# Patient Record
Sex: Male | Born: 1975 | Race: Black or African American | Hispanic: No | Marital: Married | Smoking: Current every day smoker
Health system: Southern US, Community
[De-identification: ages and names within clinical notes are randomized; demographics above are authoritative.]

## PROBLEM LIST (undated history)

## (undated) DIAGNOSIS — K219 Gastro-esophageal reflux disease without esophagitis: Secondary | ICD-10-CM

---

## 2004-08-03 ENCOUNTER — Emergency Department (HOSPITAL_COMMUNITY): Admission: EM | Admit: 2004-08-03 | Discharge: 2004-08-03 | Payer: Self-pay | Admitting: Emergency Medicine

## 2017-10-16 ENCOUNTER — Emergency Department (HOSPITAL_COMMUNITY)
Admission: EM | Admit: 2017-10-16 | Discharge: 2017-10-16 | Disposition: A | Discharge status: Home / Self Care | Payer: BLUE CROSS/BLUE SHIELD | Attending: Emergency Medicine | Admitting: Emergency Medicine

## 2017-10-16 ENCOUNTER — Encounter (HOSPITAL_COMMUNITY): Payer: Self-pay | Admitting: Emergency Medicine

## 2017-10-16 ENCOUNTER — Other Ambulatory Visit: Payer: Self-pay

## 2017-10-16 DIAGNOSIS — F17210 Nicotine dependence, cigarettes, uncomplicated: Secondary | ICD-10-CM | POA: Insufficient documentation

## 2017-10-16 DIAGNOSIS — R202 Paresthesia of skin: Secondary | ICD-10-CM | POA: Insufficient documentation

## 2017-10-16 NOTE — Discharge Instructions (Signed)
Please follow up with your primary care provider as needed. If symptoms persist, or you have new or worsening symptoms, report back to the ER.

## 2017-10-16 NOTE — ED Triage Notes (Signed)
Patient reports tingling to left hand and left foot x1 hour. Ambulatory. Denies pain. Denies blurred vision, headache, and weakness. Reports tingling worsens with movement. A&Ox4. Equal grips bilaterally.

## 2017-10-16 NOTE — ED Provider Notes (Signed)
[facility]-EMERGENCY DEPT Provider Note   CSN: [id number] Arrival date & time: 10/16/17  1356     History   Chief Complaint Chief Complaint  Patient presents with  . Tingling    HPI Julian Page is a 42 y.o. male without significant PMHx, presenting to ED with bilateral hand and foot tingling that began about 3 hours ago. 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 He states he wears a respirator at work for protection from chemicals. States he had a normal day at work. Was looking at a sign posted on the wall for "symptom to watch for" and he states he began feeling those symptoms shortly following. States he had some tingling sensation to /l hands and feet which has been improving and is nearly gone. Also states he felt like he had phlegm in his throat, which is also gone. Denies and respiratory complaints, HA, vision changes, abdominal pain, nausea, or other complaints.  The history is provided by the patient.    History reviewed. No pertinent past medical history.  There are no active problems to display for this patient.   History reviewed. No pertinent surgical history.     Home Medications    Prior to Admission medications   Medication Sig Start Date End Date Taking? Authorizing Provider  Multiple Vitamin (ONE-A-DAY MENS PO) Take 1 tablet by mouth daily.   Yes [provider]    Family History No family history on file.  Social History Social History   Tobacco Use  . Smoking status: Current Every Day Smoker  Substance Use Topics  . Alcohol use: Yes    Alcohol/week: 6.0 oz    Types: 10 Cans of beer per week  . Drug use: No     Allergies   Penicillins   Review of Systems Review of Systems  Eyes: Negative for visual disturbance.  Respiratory: Negative for chest tightness and shortness of breath.   Cardiovascular: Negative for chest pain.  Gastrointestinal: Negative for abdominal  pain.  Musculoskeletal: Negative for neck pain.  Neurological: Negative for facial asymmetry, speech difficulty, weakness, numbness and headaches.  Psychiatric/Behavioral: Negative for confusion.  All other systems reviewed and are negative.    Physical Exam Updated Vital Signs BP (!) 140/97 (BP Location: Right Arm)   Pulse 77   Temp 98.2 F (36.8 C)   Resp 16   Ht 6\' 2"  (1.88 m)   Wt 70.3 kg (155 lb)   SpO2 100%   BMI 19.90 kg/m   Physical Exam  Constitutional: He is oriented to person, place, and time. He appears well-developed and well-nourished. No distress.  HENT:  Head: Normocephalic and atraumatic.  Mouth/Throat: Oropharynx is clear and moist.  Eyes: Conjunctivae and EOM are normal. Pupils are equal, round, and reactive to light.  Neck: Normal range of motion.  Cardiovascular: Normal rate, regular rhythm, normal heart sounds and intact distal pulses.  Pulmonary/Chest: Effort normal and breath sounds normal. No respiratory distress.  Abdominal: Soft. Bowel sounds are normal.  Neurological: He is alert and oriented to person, place, and time.  Mental Status:  Alert, oriented, thought content appropriate, able to give a coherent history. Speech fluent without evidence of aphasia. Able to follow 2 step commands without difficulty.  Cranial Nerves:  II:  Peripheral visual fields grossly normal, pupils equal, round, reactive to light III,IV, VI: ptosis not present, extra-ocular motions intact bilaterally  V,VII: smile symmetric, facial light touch  sensation equal VIII: hearing grossly normal to voice  X: uvula elevates symmetrically  XI: bilateral shoulder shrug symmetric and strong XII: midline tongue extension without fassiculations Motor:  Normal tone. 5/5 in upper and lower extremities bilaterally including strong and equal grip strength and dorsiflexion/plantar flexion Sensory: Pinprick and light touch normal in all extremities.  Deep Tendon Reflexes: 2+ and  symmetric in the biceps and patella Cerebellar: normal finger-to-nose with bilateral upper extremities Gait: normal gait and balance CV: distal pulses palpable throughout    Skin: Skin is warm.  Psychiatric: He has a normal mood and affect. His behavior is normal.  Nursing note and vitals reviewed.    ED Treatments / Results  Labs (all labs ordered are listed, but only abnormal results are displayed) Labs Reviewed - No data to display  EKG  EKG Interpretation None       Radiology No results found.  Procedures Procedures (including critical care time)  Medications Ordered in ED Medications - No data to display   Initial Impression / Assessment and Plan / ED Course  I have reviewed the triage vital signs and the nursing notes.  Pertinent labs & imaging results that were available during my care of the patient were reviewed by me and considered in my medical decision making (see chart for details).     Patient presenting with bilateral foot and hand tingling times 3 hours, that has been improving since that time.  Patient with possible exposure to toluene chemical substance as he works in [employer].  Patient without respiratory complaints, or any other symptoms.  Normal neurologic exam, lungs clear, patient well-appearing not in distress.  No comorbidities.  Patient discussed with Dr. Freida Busman, who recommends patient is safe for discharge with strict return precautions.  Discussed strict return precautions with patient, including worsening symptoms, or new neurologic or respiratory symptoms.  Patient agrees with care plan and is safe for discharge.  Discussed results, findings, treatment and follow up. Patient advised of return precautions. Patient verbalized understanding and agreed with plan.   Final Clinical Impressions(s) / ED Diagnoses   Final diagnoses:  Tingling    ED Discharge Orders    None       Annina Piotrowski, [place] N, PA-C 10/16/17 1734      Lorre Nick, MD 10/16/17 1746

## 2017-12-15 ENCOUNTER — Other Ambulatory Visit: Payer: Self-pay

## 2017-12-15 ENCOUNTER — Emergency Department (INDEPENDENT_AMBULATORY_CARE_PROVIDER_SITE_OTHER)
Admission: EM | Admit: 2017-12-15 | Discharge: 2017-12-15 | Disposition: A | Discharge status: Home / Self Care | Payer: BLUE CROSS/BLUE SHIELD | Source: Home / Self Care | Attending: Family Medicine | Admitting: Family Medicine

## 2017-12-15 ENCOUNTER — Emergency Department (INDEPENDENT_AMBULATORY_CARE_PROVIDER_SITE_OTHER): Payer: BLUE CROSS/BLUE SHIELD

## 2017-12-15 ENCOUNTER — Encounter: Payer: Self-pay | Admitting: Emergency Medicine

## 2017-12-15 DIAGNOSIS — R0602 Shortness of breath: Secondary | ICD-10-CM

## 2017-12-15 MED ORDER — PREDNISONE 20 MG PO TABS: ORAL_TABLET | ORAL | 0 refills | Status: DC

## 2017-12-15 NOTE — ED Provider Notes (Signed)
[facility]    CSN: [id number] Arrival date & time: 12/15/17  1308     History   Chief Complaint Chief Complaint  Patient presents with  . Shortness of Breath  . Dysphagia    HPI Julian Page is a 42 y.o. male.   Patient reports that about 2.5 weeks ago he inhaled a small amount of chemical vapor at work, and subsequently underwent evaluation with physical exam.  Five days ago he had difficulty swallowing and shortness of breath.  He was prescribed an albuterol inhaler and fluticasone nasal spray which have not been helpful.  He feels well otherwise.  No fevers, chills, and sweats. His son has asthma.  The history is provided by the patient.    History reviewed. No pertinent past medical history.  There are no active problems to display for this patient.   History reviewed. No pertinent surgical history.     Home Medications    Prior to Admission medications   Medication Sig Start Date End Date Taking? Authorizing Provider  albuterol (PROVENTIL) (2.5 MG/3ML) 0.083% nebulizer solution Take 2.5 mg by nebulization every 4 (four) hours as needed for wheezing or shortness of breath.   Yes [provider]  fluticasone (VERAMYST) 27.5 MCG/SPRAY nasal spray Place 2 sprays into the nose daily.   Yes [provider]  Multiple Vitamin (ONE-A-DAY MENS PO) Take 1 tablet by mouth daily.    [provider]  predniSONE (DELTASONE) 20 MG tablet Take one tab by mouth twice daily for 4 days, then one daily. Take with food. 12/15/17   Lattie Haw, MD    Family History No family history on file.  Social History Social History   Tobacco Use  . Smoking status: Current Every Day Smoker  . Smokeless tobacco: Never Used  Substance Use Topics  . Alcohol use: Yes    Alcohol/week: 6.0 oz    Types: 10 Cans of beer per week  . Drug use: No     Allergies   Penicillins   Review of Systems Review of Systems No sore throat No cough No  pleuritic pain, but feels tight in his anterior chesrt No wheezing + nasal congestion No post-nasal drainage No sinus pain/pressure No itchy/red eyes No earache No hemoptysis + SOB No fever/chills No nausea No vomiting No abdominal pain No diarrhea No urinary symptoms No skin rash + fatigue No myalgias No headache Used OTC meds without relief   Physical Exam Triage Vital Signs ED Triage Vitals  Enc Vitals Group     BP 12/15/17 1349 111/75     Pulse Rate 12/15/17 1349 82     Resp 12/15/17 1349 18     Temp 12/15/17 1349 98.9 F (37.2 C)     Temp Source 12/15/17 1349 Oral     SpO2 12/15/17 1349 100 %     Weight 12/15/17 1350 155 lb (70.3 kg)     Height 12/15/17 1350 6\' 2"  (1.88 m)     Head Circumference --      Peak Flow --      Pain Score 12/15/17 1350 0     Pain Loc --      Pain Edu? --      Excl. in GC? --    No data found.  Updated Vital Signs BP 111/75 (BP Location: Right Arm)   Pulse 82   Temp 98.9 F (37.2 C) (Oral)   Resp 18   Ht 6\' 2"  (1.88 m)  Wt 155 lb (70.3 kg)   SpO2 100%   BMI 19.90 kg/m   Visual Acuity Right Eye Distance:   Left Eye Distance:   Bilateral Distance:    Right Eye Near:   Left Eye Near:    Bilateral Near:     Physical Exam Nursing notes and Vital Signs reviewed. Appearance:  Patient appears stated age, and in no acute distress Eyes:  Pupils are equal, round, and reactive to light and accomodation.  Extraocular movement is intact.  Conjunctivae are not inflamed  Ears:  Canals normal.  Tympanic membranes normal.  Nose:  Mildly congested turbinates.  No sinus tenderness.   Pharynx:  Normal Neck:  Supple.  No adenopathy.  Mild tenderness to palpation over thyroid.  Lungs:  Clear to auscultation.  Breath sounds are equal.  Moving air well. Heart:  Regular rate and rhythm without murmurs, rubs, or gallops.  Abdomen:  Nontender without masses or hepatosplenomegaly.  Bowel sounds are present.  No CVA or flank tenderness.    Extremities:  No edema.  Skin:  No rash present.    UC Treatments / Results  Labs (all labs ordered are listed, but only abnormal results are displayed) Labs Reviewed - No data to display  EKG None Radiology Dg Chest 2 View  Result Date: 12/15/2017 CLINICAL DATA:  Shortness of breath for 2 weeks, mild dyspnea for 2.5 weeks EXAM: CHEST - 2 VIEW COMPARISON:  None FINDINGS: Normal heart size, mediastinal contours, and pulmonary vascularity. Lungs mildly hyperinflated but clear. No pulmonary infiltrate, pleural effusion or pneumothorax. Bones unremarkable. IMPRESSION: Mild hyperinflation without acute infiltrate. Electronically Signed   By: Ulyses SouthwardMark  Boles M.D.   On: 12/15/2017 14:30    Procedures Procedures (including critical care time)  Medications Ordered in UC Medications - No data to display   Initial Impression / Assessment and Plan / UC Course  I have reviewed the triage vital signs and the nursing notes.  Pertinent labs & imaging results that were available during my care of the patient were reviewed by me and considered in my medical decision making (see chart for details).    Suspect mild reactive airways disease. Begin prednisone burst/taper. May continue Mucinex D for cough and congestion.  Continue fluticasone nasal spray for allergy symptoms. Followup with Family Doctor if not improved in one to two weeks.    Final Clinical Impressions(s) / UC Diagnoses   Final diagnoses:  Shortness of breath    ED Discharge Orders        Ordered    predniSONE (DELTASONE) 20 MG tablet     12/15/17 1456         Lattie HawBeese, Damyon Mullane A, MD 12/20/17 2203

## 2017-12-15 NOTE — ED Triage Notes (Signed)
Patient reports about 3 weeks ago inhaled small amount of chemical at work; followed with evaluation and physical; 5 days ago went again for difficulty swallowing and some shortness of breath; given inhaler and nasal spray which have not helped.

## 2017-12-15 NOTE — Discharge Instructions (Addendum)
May continue Mucinex D for cough and congestion.  Continue fluticasone nasal spray for allergy symptoms.

## 2017-12-25 ENCOUNTER — Telehealth: Payer: Self-pay

## 2017-12-25 NOTE — Telephone Encounter (Signed)
Pt called and is having the same sx from his previous visit.  He is asking for a renewal of his prednisone.  Spoke with Langston Masker, PA-C and script called in to [facility].

## 2018-01-14 ENCOUNTER — Emergency Department (INDEPENDENT_AMBULATORY_CARE_PROVIDER_SITE_OTHER)
Admission: EM | Admit: 2018-01-14 | Discharge: 2018-01-14 | Disposition: A | Discharge status: Home / Self Care | Payer: Managed Care, Other (non HMO) | Source: Home / Self Care | Attending: Family Medicine | Admitting: Family Medicine

## 2018-01-14 DIAGNOSIS — R0989 Other specified symptoms and signs involving the circulatory and respiratory systems: Secondary | ICD-10-CM

## 2018-01-14 DIAGNOSIS — K219 Gastro-esophageal reflux disease without esophagitis: Secondary | ICD-10-CM | POA: Diagnosis not present

## 2018-01-14 MED ORDER — PREDNISONE 20 MG PO TABS: ORAL_TABLET | ORAL | 0 refills | Status: AC

## 2018-01-14 MED ORDER — OMEPRAZOLE 20 MG PO CPDR
20.0000 mg | DELAYED_RELEASE_CAPSULE | Freq: Every day | ORAL | 0 refills | Status: AC
Start: 2018-01-14 — End: ?

## 2018-01-14 NOTE — ED Provider Notes (Addendum)
[facility]    CSN: [id number] Arrival date & time: 01/14/18  1313     History   Chief Complaint Chief Complaint  Patient presents with  . Sore Throat    HPI Julian Page is a 42 y.o. male.   HPI Julian Page is a 42 y.o. male presenting to UC with c/o throat irritation that has been intermittent for about 1 month with associated difficulty breathing at times.  Symptoms started at work when he inhaled a chemical. He no longer works in that area. He did not file worker's comp.  He was evaluated at that time, prescribed Flonase and albuterol inhaler but no relief.  He was seen at [facility] for same on 12/15/17 and was prescribed prednisone.  This did seem to provide moderate temporary relief but he is now out of the medication and requesting more.  He does not have a PCP.  Denies fever, chills, congestion. Denies difficulty swallowing water.  No vomiting.  He does get heart burn occasionally but he has not taken anything for acid reflux.     History reviewed. No pertinent past medical history.  There are no active problems to display for this patient.   History reviewed. No pertinent surgical history.     Home Medications    Prior to Admission medications   Medication Sig Start Date End Date Taking? Authorizing Provider  albuterol (PROVENTIL) (2.5 MG/3ML) 0.083% nebulizer solution Take 2.5 mg by nebulization every 4 (four) hours as needed for wheezing or shortness of breath.    [provider]  fluticasone (VERAMYST) 27.5 MCG/SPRAY nasal spray Place 2 sprays into the nose daily.    [provider]  Multiple Vitamin (ONE-A-DAY MENS PO) Take 1 tablet by mouth daily.    [provider]  omeprazole (PRILOSEC) 20 MG capsule Take 1 capsule (20 mg total) by mouth daily. 01/14/18   Lurene Shadow, PA-C  predniSONE (DELTASONE) 20 MG tablet 3 tabs po day one, then 2 po daily x 4 days 01/14/18   Lurene Shadow, PA-C    Family History No family history on  file.  Social History Social History   Tobacco Use  . Smoking status: Current Every Day Smoker  . Smokeless tobacco: Never Used  Substance Use Topics  . Alcohol use: Yes    Alcohol/week: 6.0 oz    Types: 10 Cans of beer per week  . Drug use: No     Allergies   Penicillins   Review of Systems Review of Systems  Constitutional: Negative for chills and fever.  HENT: Positive for sore throat and trouble swallowing. Negative for congestion, postnasal drip, rhinorrhea, sinus pain and voice change.   Respiratory: Positive for cough ( mild intermittent, dry). Negative for choking, chest tightness, shortness of breath, wheezing and stridor.   Gastrointestinal: Negative for abdominal pain, nausea, rectal pain and vomiting.     Physical Exam Triage Vital Signs ED Triage Vitals  Enc Vitals Group     BP 01/14/18 1443 137/90     Pulse Rate 01/14/18 1443 80     Resp 01/14/18 1443 18     Temp 01/14/18 1443 98.6 F (37 C)     Temp Source 01/14/18 1443 Oral     SpO2 01/14/18 1443 100 %     Weight 01/14/18 1444 152 lb 8 oz (69.2 kg)     Height 01/14/18 1444  (1.88 m)     Head Circumference --      Peak  Flow --      Pain Score 01/14/18 1444 0     Pain Loc --      Pain Edu? --      Excl. in GC? --    No data found.  Updated Vital Signs BP 137/90 (BP Location: Right Arm)   Pulse 80   Temp 98.6 F (37 C) (Oral)   Resp 18   Ht  (1.88 m)   Wt 152 lb 8 oz (69.2 kg)   SpO2 100%   BMI 19.58 kg/m   Visual Acuity Right Eye Distance:   Left Eye Distance:   Bilateral Distance:    Right Eye Near:   Left Eye Near:    Bilateral Near:     Physical Exam  Constitutional: He is oriented to person, place, and time. He appears well-developed and well-nourished.  Non-toxic appearance. He does not appear ill. No distress.  HENT:  Head: Normocephalic and atraumatic.  Right Ear: Tympanic membrane normal.  Left Ear: Tympanic membrane normal.  Nose: Nose normal. Right sinus  exhibits no maxillary sinus tenderness and no frontal sinus tenderness. Left sinus exhibits no maxillary sinus tenderness and no frontal sinus tenderness.  Mouth/Throat: Uvula is midline, oropharynx is clear and moist and mucous membranes are normal.  Eyes: EOM are normal.  Neck: Normal range of motion. Neck supple. No thyromegaly present.  Cardiovascular: Normal rate and regular rhythm.  Pulmonary/Chest: Effort normal and breath sounds normal. No stridor. No respiratory distress. He has no wheezes. He has no rhonchi.  Musculoskeletal: Normal range of motion.  Lymphadenopathy:    He has no cervical adenopathy.  Neurological: He is alert and oriented to person, place, and time.  Skin: Skin is warm and dry.  Psychiatric: He has a normal mood and affect. His behavior is normal.  Nursing note and vitals reviewed.    UC Treatments / Results  Labs (all labs ordered are listed, but only abnormal results are displayed) Labs Reviewed - No data to display  EKG None  Radiology No results found.  Procedures Procedures (including critical care time)  Medications Ordered in UC Medications - No data to display  Initial Impression / Assessment and Plan / UC Course  I have reviewed the triage vital signs and the nursing notes.  Pertinent labs & imaging results that were available during my care of the patient were reviewed by me and considered in my medical decision making (see chart for details).     No stridor heard on exam. No evidence of respiratory distress.  Reviewed CXR and medical note from prior visit to [facility] Will refill the prednisone as that provided temporary relief.  Possible reactive airway still causing some trouble. Hx also concerning for possible GERD/esophagitis given occasional heartburn and dry cough.  Will start pt on omeprazole. Strongly encouraged f/u with PCP and possibly ENT.   Final Clinical Impressions(s) / UC Diagnoses   Final diagnoses:  Gastroesophageal  reflux disease, esophagitis presence not specified  Foreign body sensation in throat     Discharge Instructions      You may try these medications as prescribed to see if they help with your symptoms.   If your symptoms persist, it is important to schedule an appointment to establish care with a family doctor for them to perform more tests and try different treatments based on those tests. You may also try to schedule an appointment with an ear, nose and throat (ENT) doctor who can use a scope to look  in your throat to see if there is any visible cause of your symptoms.    Do not hesitate to call 911 or go to the hospital if your symptoms worsen- difficulty breathing, unable to swallow or if you vomit shortly after eating or drinking.     ED Prescriptions    Medication Sig Dispense Auth. Provider   omeprazole (PRILOSEC) 20 MG capsule Take 1 capsule (20 mg total) by mouth daily. 30 capsule Doroteo Glassman, Mohab Ashby O, PA-C   predniSONE (DELTASONE) 20 MG tablet 3 tabs po day one, then 2 po daily x 4 days 11 tablet Lurene Shadow, PA-C     Controlled Substance Prescriptions [place] Controlled Substance Registry consulted? Not Applicable   Rolla Plate 01/14/18 1604    Lurene Shadow, [place] 01/14/18 1606

## 2018-01-14 NOTE — ED Triage Notes (Signed)
Pt c/o difficulty swallowing for a month and difficulty breathing.

## 2018-01-14 NOTE — Discharge Instructions (Signed)
°  You may try these medications as prescribed to see if they help with your symptoms.   If your symptoms persist, it is important to schedule an appointment to establish care with a family doctor for them to perform more tests and try different treatments based on those tests. You may also try to schedule an appointment with an ear, nose and throat (ENT) doctor who can use a scope to look in your throat to see if there is any visible cause of your symptoms.    Do not hesitate to call 911 or go to the hospital if your symptoms worsen- difficulty breathing, unable to swallow or if you vomit shortly after eating or drinking.

## 2018-01-20 ENCOUNTER — Other Ambulatory Visit: Payer: Self-pay

## 2018-01-20 ENCOUNTER — Emergency Department (INDEPENDENT_AMBULATORY_CARE_PROVIDER_SITE_OTHER)
Admission: EM | Admit: 2018-01-20 | Discharge: 2018-01-20 | Disposition: A | Discharge status: Home / Self Care | Payer: Managed Care, Other (non HMO) | Source: Home / Self Care | Attending: Emergency Medicine | Admitting: Emergency Medicine

## 2018-01-20 ENCOUNTER — Encounter: Payer: Self-pay | Admitting: Emergency Medicine

## 2018-01-20 DIAGNOSIS — K2100 Gastro-esophageal reflux disease with esophagitis, without bleeding: Secondary | ICD-10-CM

## 2018-01-20 MED ORDER — OMEPRAZOLE 40 MG PO CPDR: 40.0000 mg | DELAYED_RELEASE_CAPSULE | Freq: Every day | ORAL | 0 refills | Status: AC

## 2018-01-20 NOTE — Discharge Instructions (Signed)
Based on your history and physical exam, diagnosis is reflux esophagitis.Please see attached instruction sheet on gastroesophageal reflux and food choices for esophageal reflux. The 20 mg omeprazole is helping somewhat, but I feel you need a higher dose for 10 days. New prescription for omeprazole 40 mg daily 10 days, sent to your pharmacy. After completing the 10 days of 40 mg omeprazole daily, go back to taking 20 mg omeprazole every day for another 3 weeks. You need to schedule follow-up appointment and establish with a new PCP in 7-10 days. Stay away from alcohol and quit smoking. If any severe worsening symptoms,such as vomiting you need to go to the emergency room.

## 2018-01-20 NOTE — ED Triage Notes (Signed)
Pt returns after finishing meds rx'd at last visit. Symptoms returned before he was able to establish care with new PCP. Ate dinner last night and he started getting that hard to swallow feeling again.

## 2018-01-20 NOTE — ED Provider Notes (Signed)
[facility]    CSN: [id number] Arrival date & time: 01/20/18  1335     History   Chief Complaint Chief Complaint  Patient presents with  . Gastroesophageal Reflux    HPI Julian Page is a 42 y.o. male.   HPI Chief complaint is a flareup of gastro-esophageal reflux symptoms last night after eating seasoned fried chicken last night. He has had symptoms of GE reflux for over a month with a feeling of burning substernally after meals with reflux rising up to the back of the throat with throat irritation and occasional cough from this.  Denies exertional chest pain or any shortness of breath.  Has not noted any wheezing. He was seen here in urgent care by Waylan Rocher on 01/14/2018, diagnosed with GE reflux and was prescribed prednisone, 5-day oral burst and omeprazole 20 mg daily. He particularly felt the omeprazole was helping somewhat, but then had breakthrough symptoms last night. He denies feeling that food is getting stuck in his throat.  He is able to drink liquids without problems.   Denies nausea or vomiting. Denies abdominal pain. He states he has been trying to eat a healthy diet and has stopped drinking alcohol and cut back on smoking. History reviewed. No pertinent past medical history. Otherwise denies any chronic medical problems.  Denies history of asthma, although he has had wheezing in the past, not currently. There are no active problems to display for this patient.   History reviewed. No pertinent surgical history.  Denies past surgery   Home Medications    Prior to Admission medications   Medication Sig Start Date End Date Taking? Authorizing Provider  albuterol (PROVENTIL) (2.5 MG/3ML) 0.083% nebulizer solution Take 2.5 mg by nebulization every 4 (four) hours as needed for wheezing or shortness of breath.    [provider]  fluticasone (VERAMYST) 27.5 MCG/SPRAY nasal spray Place 2 sprays into the nose daily.    [provider]    Multiple Vitamin (ONE-A-DAY MENS PO) Take 1 tablet by mouth daily.    [provider]  omeprazole (PRILOSEC) 20 MG capsule Take 1 capsule (20 mg total) by mouth daily. 01/14/18   Lurene Shadow, PA-C  omeprazole (PRILOSEC) 40 MG capsule Take 1 capsule (40 mg total) by mouth daily. 01/20/18   Lajean Manes, MD  predniSONE (DELTASONE) 20 MG tablet 3 tabs po day one, then 2 po daily x 4 days 01/14/18   Lurene Shadow, PA-C    Family History History reviewed. No pertinent family history.  Social History Social History   Tobacco Use  . Smoking status: Current Every Day Smoker  . Smokeless tobacco: Never Used  Substance Use Topics  . Alcohol use: Yes    Alcohol/week: 6.0 oz    Types: 10 Cans of beer per week  . Drug use: No     Allergies   Penicillins   Review of Systems Review of Systems  All other systems reviewed and are negative.  See also HPI for pertinent review of systems  Physical Exam Triage Vital Signs ED Triage Vitals  Enc Vitals Group     BP 01/20/18 1400 132/87     Pulse Rate 01/20/18 1400 94     Resp --      Temp 01/20/18 1400 98.5 F (36.9 C)     Temp Source 01/20/18 1400 Oral     SpO2 01/20/18 1400 98 %     Weight 01/20/18 1401 153 lb (69.4 kg)  Height 01/20/18 1401  (1.854 m)     Head Circumference --      Peak Flow --      Pain Score 01/20/18 1401 0     Pain Loc --      Pain Edu? --      Excl. in GC? --    No data found.  Updated Vital Signs BP 132/87 (BP Location: Right Arm)   Pulse 94   Temp 98.5 F (36.9 C) (Oral)   Ht  (1.854 m)   Wt 153 lb (69.4 kg)   SpO2 98%   BMI 20.19 kg/m   Physical Exam  Constitutional: He is oriented to person, place, and time. He appears well-developed and well-nourished. No distress.  HENT:  Head: Normocephalic and atraumatic.  Oropharynx: Clear Nose: Normal Ears: Normal Voice is normal without hoarseness  Eyes: Pupils are equal, round, and reactive to light. No scleral icterus.   Neck: Normal range of motion. Neck supple. No JVD present. No tracheal deviation present. No thyromegaly present.  Cardiovascular: Normal rate, regular rhythm and normal heart sounds.  Pulmonary/Chest: Effort normal and breath sounds normal.  Abdominal: He exhibits no distension.  Lymphadenopathy:    He has no cervical adenopathy.  Neurological: He is alert and oriented to person, place, and time. No cranial nerve deficit.  Skin: Skin is warm and dry.  Psychiatric: He has a normal mood and affect. His behavior is normal.  Vitals reviewed.    UC Treatments / Results  Labs (all labs ordered are listed, but only abnormal results are displayed) Labs Reviewed - No data to display  EKG None  Radiology No results found.  Procedures Procedures (including critical care time)  Medications Ordered in UC Medications - No data to display  Initial Impression / Assessment and Plan / UC Course  I have reviewed the triage vital signs and the nursing notes.  Pertinent labs & imaging results that were available during my care of the patient were reviewed by me and considered in my medical decision making (see chart for details).     Clinically, no evidence of any obstruction.  Lungs are clear without sign of wheezing. Final Clinical Impressions(s) / UC Diagnoses   Final diagnoses:  Reflux esophagitis  Treatment options discussed, as well as risks, benefits, alternatives. Patient voiced understanding and agreement with the following plans:    Discharge Instructions     Based on your history and physical exam, diagnosis is reflux esophagitis.Please see attached instruction sheet on gastroesophageal reflux and food choices for esophageal reflux. The 20 mg omeprazole is helping somewhat, but I feel you need a higher dose for 10 days. New prescription for omeprazole 40 mg daily 10 days, sent to your pharmacy. After completing the 10 days of 40 mg omeprazole daily, go back to taking 20 mg  omeprazole every day for another 3 weeks. You need to schedule follow-up appointment and establish with a new PCP in 7-10 days. Stay away from alcohol and quit smoking. If any severe worsening symptoms,such as vomiting you need to go to the emergency room.     ED Prescriptions    Medication Sig Dispense Auth. Provider   omeprazole (PRILOSEC) 40 MG capsule Take 1 capsule (40 mg total) by mouth daily. 10 capsule Lajean Manes, MD      Precautions discussed. Red flags discussed.-Emergency room if any red flag Questions invited and answered. Patient voiced understanding and agreement.    Lajean Manes, MD 01/20/18 Wind[facility] Moment

## 2018-01-25 ENCOUNTER — Telehealth: Payer: Self-pay

## 2018-01-25 NOTE — Telephone Encounter (Signed)
Pt came in and stated that he had developed a rash on his face.  Spoke with Dr Milus Glazier, and he ordered Zantac 150 mg, po BID #30, 0 refills.  Called to [facility], [address].

## 2018-02-08 ENCOUNTER — Ambulatory Visit: Payer: Managed Care, Other (non HMO) | Admitting: Physician Assistant

## 2018-02-14 ENCOUNTER — Ambulatory Visit: Payer: Managed Care, Other (non HMO) | Admitting: Physician Assistant

## 2018-12-23 IMAGING — DX DG CHEST 2V
2 series · 2 of 2 positions shown · non-contrast
Comparison: None

CLINICAL DATA: Shortness of breath for 2 weeks, mild dyspnea for
2.5 weeks

EXAM:
CHEST - 2 VIEW

[chest pa]
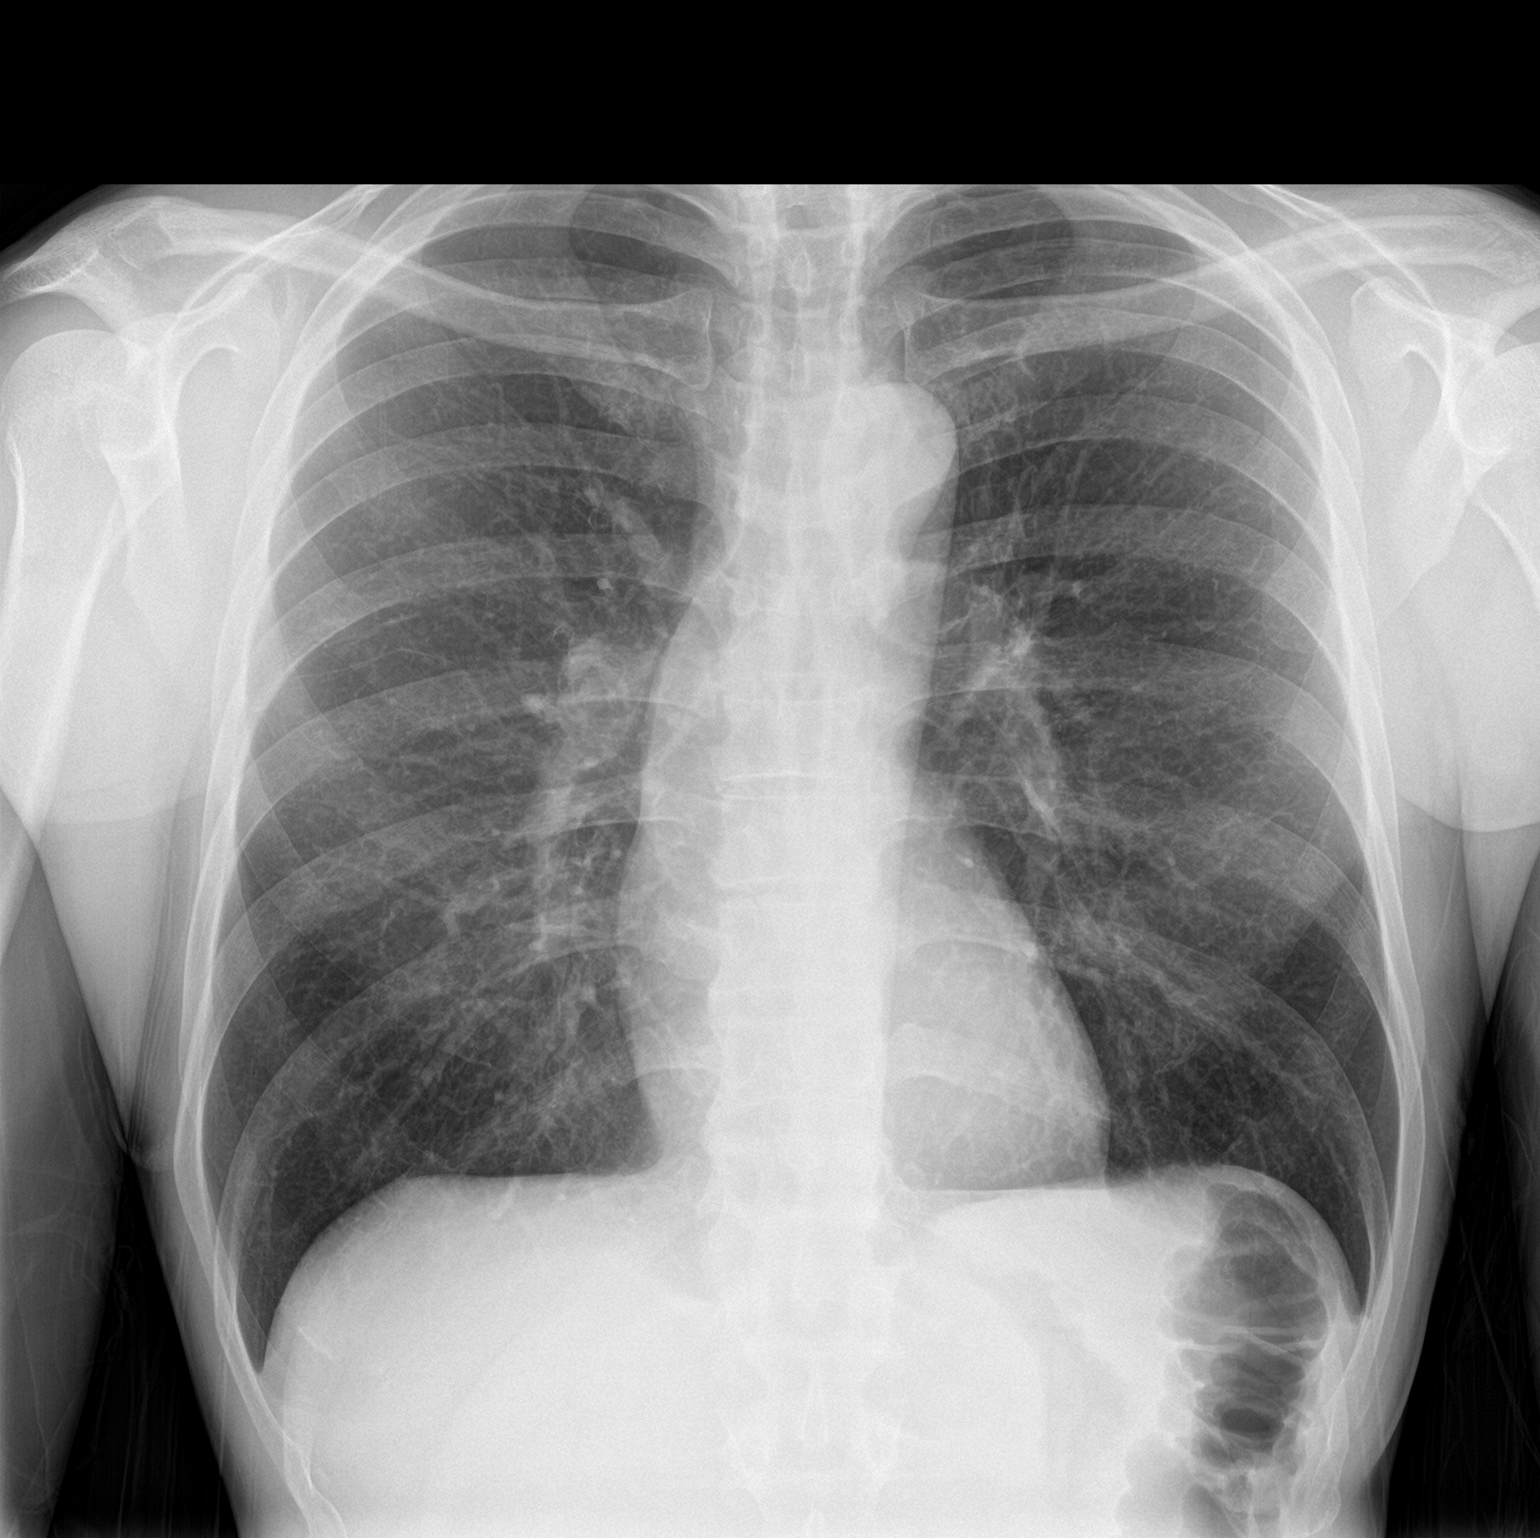

[chest lat]
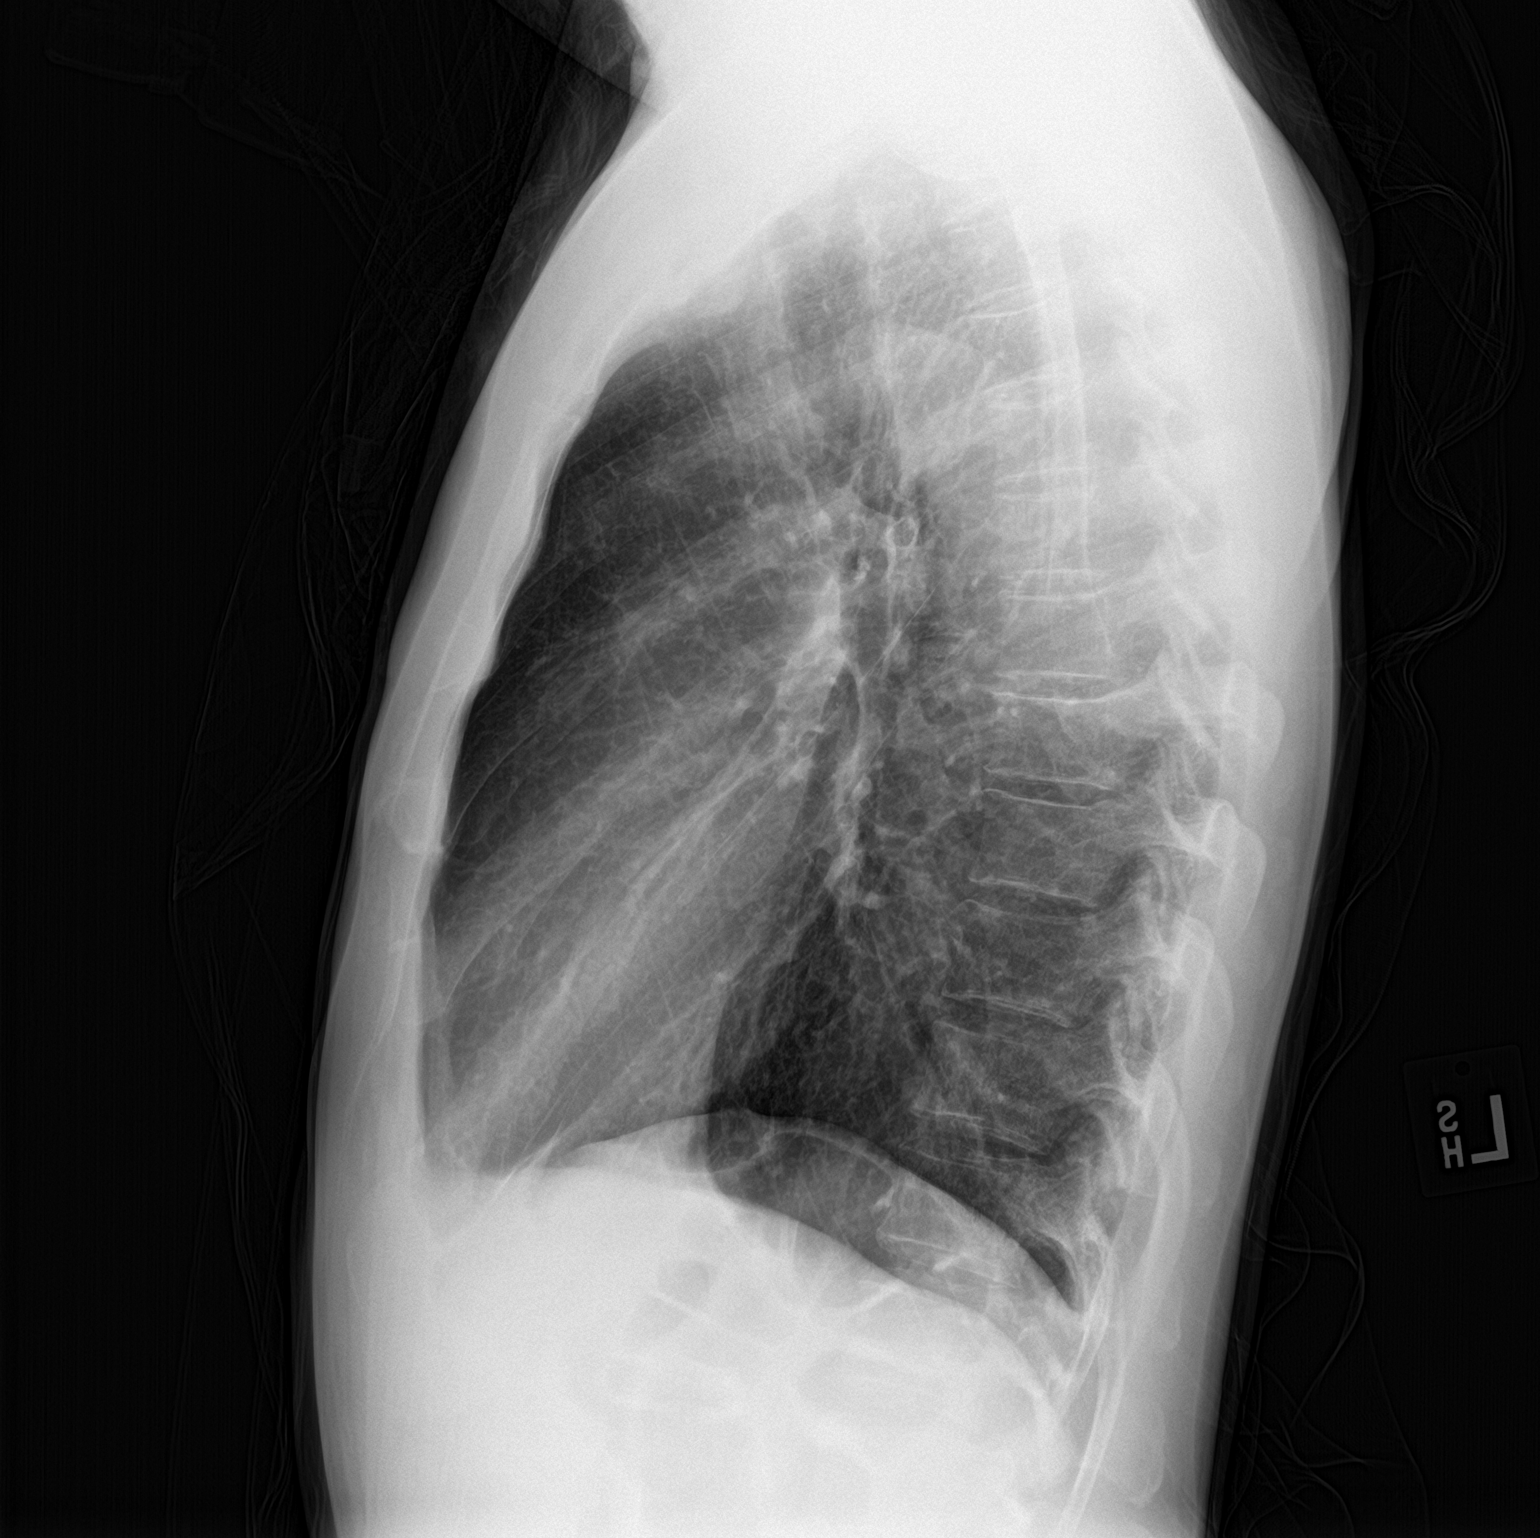

[2 of 2 positions shown; findings below may reference images not displayed]

FINDINGS: Normal heart size, mediastinal contours, and pulmonary vascularity.

Lungs mildly hyperinflated but clear.

No pulmonary infiltrate, pleural effusion or pneumothorax.

Bones unremarkable.
IMPRESSION: Mild hyperinflation without acute infiltrate.

## 2021-07-04 ENCOUNTER — Other Ambulatory Visit: Payer: Self-pay

## 2021-07-04 ENCOUNTER — Emergency Department (HOSPITAL_COMMUNITY)
Admission: EM | Admit: 2021-07-04 | Discharge: 2021-07-04 | Disposition: A | Discharge status: Home / Self Care | Payer: Managed Care, Other (non HMO) | Attending: Emergency Medicine | Admitting: Emergency Medicine

## 2021-07-04 ENCOUNTER — Encounter (HOSPITAL_COMMUNITY): Payer: Self-pay | Admitting: Emergency Medicine

## 2021-07-04 DIAGNOSIS — K219 Gastro-esophageal reflux disease without esophagitis: Secondary | ICD-10-CM | POA: Insufficient documentation

## 2021-07-04 DIAGNOSIS — Z5321 Procedure and treatment not carried out due to patient leaving prior to being seen by health care provider: Secondary | ICD-10-CM | POA: Insufficient documentation

## 2021-07-04 HISTORY — DX: Gastro-esophageal reflux disease without esophagitis: K21.9

## 2021-07-04 NOTE — ED Triage Notes (Signed)
Patient comes in complaining of a flare up of his GERD. Patient states he was burping stomach acid. Patient states he has been eating greasy and spicy foods. Patient takes OTC pepcid but it has not helped. Patient diagnosed GERD.

## 2021-07-04 NOTE — ED Notes (Signed)
Patient handed his stickers to registration and stated he was leaving.

## 2021-08-22 ENCOUNTER — Encounter (HOSPITAL_BASED_OUTPATIENT_CLINIC_OR_DEPARTMENT_OTHER): Payer: Self-pay | Admitting: *Deleted

## 2021-08-22 ENCOUNTER — Emergency Department (HOSPITAL_BASED_OUTPATIENT_CLINIC_OR_DEPARTMENT_OTHER)
Admission: EM | Admit: 2021-08-22 | Discharge: 2021-08-22 | Disposition: A | Discharge status: Home / Self Care | Payer: Managed Care, Other (non HMO) | Attending: Emergency Medicine | Admitting: Emergency Medicine

## 2021-08-22 ENCOUNTER — Other Ambulatory Visit: Payer: Self-pay

## 2021-08-22 DIAGNOSIS — R030 Elevated blood-pressure reading, without diagnosis of hypertension: Secondary | ICD-10-CM | POA: Diagnosis present

## 2021-08-22 DIAGNOSIS — I10 Essential (primary) hypertension: Secondary | ICD-10-CM | POA: Insufficient documentation

## 2021-08-22 DIAGNOSIS — Z79899 Other long term (current) drug therapy: Secondary | ICD-10-CM | POA: Insufficient documentation

## 2021-08-22 DIAGNOSIS — F17200 Nicotine dependence, unspecified, uncomplicated: Secondary | ICD-10-CM | POA: Diagnosis not present

## 2021-08-22 MED ORDER — AMLODIPINE BESYLATE 5 MG PO TABS: 5.0000 mg | ORAL_TABLET | Freq: Every day | ORAL | 1 refills | Status: AC

## 2021-08-22 MED ORDER — AMLODIPINE BESYLATE 5 MG PO TABS
5.0000 mg | ORAL_TABLET | Freq: Once | ORAL | Status: AC
  Administered 2021-08-22: 10:00:00 5 mg via ORAL
  Filled 2021-08-22: qty 1

## 2021-08-22 NOTE — ED Notes (Signed)
NBP Manual Left Arm:  162/102 NBP Manual Right Arm: 162/104 Denies any symptoms at this time  States he has a Primary MD follow up appt in 4 weeks

## 2021-08-22 NOTE — ED Triage Notes (Signed)
Here for evaluation of High Blood Pressure, saw his primary MD and was told to monitor BP until follow up appt

## 2021-08-22 NOTE — ED Provider Notes (Signed)
[facility] Provider Note   CSN: [id number] Arrival date & time: 08/22/21  [id number]     History Chief Complaint  Patient presents with   Hypertension    Julian Page is a 45 y.o. male.  The history is provided by the patient.  Hypertension This is a new problem. The current episode started more than 1 week ago. The problem occurs every several days. The problem has not changed since onset.Pertinent negatives include no chest pain, no abdominal pain, no headaches and no shortness of breath. Nothing aggravates the symptoms. Nothing relieves the symptoms. He has tried nothing for the symptoms. The treatment provided no relief.      Past Medical History:  Diagnosis Date   GERD (gastroesophageal reflux disease)     There are no problems to display for this patient.   History reviewed. No pertinent surgical history.     History reviewed. No pertinent family history.  Social History   Tobacco Use   Smoking status: Every Day   Smokeless tobacco: Never  Vaping Use   Vaping Use: Never used  Substance Use Topics   Alcohol use: Yes    Alcohol/week: 10.0 standard drinks    Types: 10 Cans of beer per week   Drug use: No    Home Medications Prior to Admission medications   Medication Sig Start Date End Date Taking? Authorizing Provider  amLODipine (NORVASC) 5 MG tablet Take 1 tablet (5 mg total) by mouth daily. 08/22/21 09/30/21 Yes Leiann Sporer, DO  albuterol (PROVENTIL) (2.5 MG/3ML) 0.083% nebulizer solution Take 2.5 mg by nebulization every 4 (four) hours as needed for wheezing or shortness of breath.    [provider]  fluticasone (VERAMYST) 27.5 MCG/SPRAY nasal spray Place 2 sprays into the nose daily.    [provider]  Multiple Vitamin (ONE-A-DAY MENS PO) Take 1 tablet by mouth daily.    [provider]  omeprazole (PRILOSEC) 20 MG capsule Take 1 capsule (20 mg total) by mouth daily. 01/14/18   Noe Gens,  PA-C  omeprazole (PRILOSEC) 40 MG capsule Take 1 capsule (40 mg total) by mouth daily. 01/20/18   Jacqulyn Cane, MD  predniSONE (DELTASONE) 20 MG tablet 3 tabs po day one, then 2 po daily x 4 days 01/14/18   Noe Gens, PA-C    Allergies    Omeprazole and Penicillins  Review of Systems   Review of Systems  Constitutional:  Negative for chills and fever.  HENT:  Negative for ear pain and sore throat.   Eyes:  Negative for pain and visual disturbance.  Respiratory:  Negative for cough and shortness of breath.   Cardiovascular:  Negative for chest pain and palpitations.  Gastrointestinal:  Negative for abdominal pain and vomiting.  Genitourinary:  Negative for dysuria and hematuria.  Musculoskeletal:  Negative for arthralgias and back pain.  Skin:  Negative for color change and rash.  Neurological:  Negative for dizziness, tremors, seizures, syncope, facial asymmetry, speech difficulty, weakness, light-headedness, numbness and headaches.  All other systems reviewed and are negative.  Physical Exam Updated Vital Signs BP (!) 162/104 (BP Location: Right Arm)    Pulse 88    Temp 98.3 F (36.8 C) (Oral)    Resp 18    Ht 6\' 1"  (1.854 m)    Wt 77.1 kg    SpO2 100%    BMI 22.43 kg/m   Physical Exam Vitals and nursing note reviewed.  Constitutional:  General: He is not in acute distress.    Appearance: He is well-developed. He is not ill-appearing.  HENT:     Head: Normocephalic and atraumatic.  Eyes:     Extraocular Movements: Extraocular movements intact.     Conjunctiva/sclera: Conjunctivae normal.     Pupils: Pupils are equal, round, and reactive to light.  Cardiovascular:     Rate and Rhythm: Normal rate and regular rhythm.     Heart sounds: No murmur heard. Pulmonary:     Effort: Pulmonary effort is normal. No respiratory distress.     Breath sounds: Normal breath sounds.  Abdominal:     Palpations: Abdomen is soft.     Tenderness: There is no abdominal tenderness.   Musculoskeletal:        General: No swelling.     Cervical back: Neck supple.  Skin:    General: Skin is warm and dry.     Capillary Refill: Capillary refill takes less than 2 seconds.  Neurological:     General: No focal deficit present.     Mental Status: He is alert and oriented to person, place, and time.     Cranial Nerves: No cranial nerve deficit.     Sensory: No sensory deficit.     Motor: No weakness.     Coordination: Coordination normal.  Psychiatric:        Mood and Affect: Mood normal.    ED Results / Procedures / Treatments   Labs (all labs ordered are listed, but only abnormal results are displayed) Labs Reviewed - No data to display  EKG None  Radiology No results found.  Procedures Procedures   Medications Ordered in ED Medications  amLODipine (NORVASC) tablet 5 mg (has no administration in time range)    ED Course  I have reviewed the triage vital signs and the nursing notes.  Pertinent labs & imaging results that were available during my care of the patient were reviewed by me and considered in my medical decision making (see chart for details).    MDM Rules/Calculators/A&P                          Naomi Fitton is here with high blood pressure.  Blood pressure 162/104.  Has had multiple high blood pressures here over the last several weeks.  Mostly in the 140s and 150s range.  He is supposed to follow-up with his primary care doctor in a couple weeks as they are monitoring his blood pressure.  Overall decision was made to start him on amlodipine.  He is not having any stroke symptoms.  No chest pain.  Overall he is asymptomatic.  Well-appearing.  Discharged in good condition.  Understands return precautions.  This chart was dictated using voice recognition software.  Despite best efforts to proofread,  errors can occur which can change the documentation meaning.      Final Clinical Impression(s) / ED Diagnoses Final diagnoses:  Hypertension,  unspecified type    Rx / DC Orders ED Discharge Orders          Ordered    amLODipine (NORVASC) 5 MG tablet  Daily        08/22/21 0951             Virgina Norfolk, DO 08/22/21 [phone]

## 2021-08-22 NOTE — ED Notes (Signed)
BEFAST NEGATIVE upon ED admission
